# Patient Record
Sex: Female | Born: 1990 | Race: Asian | Hispanic: No | Marital: Single | Smoking: Never smoker
Health system: Southern US, Community
[De-identification: ages and names within clinical notes are randomized; demographics above are authoritative.]

## PROBLEM LIST (undated history)

## (undated) DIAGNOSIS — D649 Anemia, unspecified: Secondary | ICD-10-CM

## (undated) HISTORY — DX: Anemia, unspecified: D64.9

---

## 2017-06-20 ENCOUNTER — Other Ambulatory Visit: Payer: Self-pay

## 2017-06-20 ENCOUNTER — Encounter: Payer: Self-pay | Admitting: Family Medicine

## 2017-06-20 ENCOUNTER — Ambulatory Visit (INDEPENDENT_AMBULATORY_CARE_PROVIDER_SITE_OTHER): Payer: BLUE CROSS/BLUE SHIELD

## 2017-06-20 ENCOUNTER — Ambulatory Visit: Payer: BLUE CROSS/BLUE SHIELD | Admitting: Family Medicine

## 2017-06-20 VITALS — BP 110/74 | HR 78 | Temp 98.3°F | Ht 65.0 in | Wt 115.4 lb

## 2017-06-20 DIAGNOSIS — Z789 Other specified health status: Secondary | ICD-10-CM

## 2017-06-20 DIAGNOSIS — N6320 Unspecified lump in the left breast, unspecified quadrant: Secondary | ICD-10-CM

## 2017-06-20 DIAGNOSIS — R5383 Other fatigue: Secondary | ICD-10-CM

## 2017-06-20 DIAGNOSIS — R0789 Other chest pain: Secondary | ICD-10-CM | POA: Diagnosis not present

## 2017-06-20 DIAGNOSIS — R0600 Dyspnea, unspecified: Secondary | ICD-10-CM

## 2017-06-20 DIAGNOSIS — F4323 Adjustment disorder with mixed anxiety and depressed mood: Secondary | ICD-10-CM

## 2017-06-20 LAB — POCT CBC
GRANULOCYTE PERCENT: 61.1 % (ref 37–80)
HCT, POC: 39.6 % (ref 37.7–47.9)
Hemoglobin: 13.1 g/dL (ref 12.2–16.2)
Lymph, poc: 3.1 (ref 0.6–3.4)
MCH, POC: 28.1 pg (ref 27–31.2)
MCHC: 33 g/dL (ref 31.8–35.4)
MCV: 85.1 fL (ref 80–97)
MID (CBC): 0.2 (ref 0–0.9)
MPV: 8.4 fL (ref 0–99.8)
POC Granulocyte: 5.1 (ref 2–6.9)
POC LYMPH %: 37.1 % (ref 10–50)
POC MID %: 1.8 %M (ref 0–12)
Platelet Count, POC: 234 10*3/uL (ref 142–424)
RBC: 4.66 M/uL (ref 4.04–5.48)
RDW, POC: 13.4 %
WBC: 8.4 10*3/uL (ref 4.6–10.2)

## 2017-06-20 LAB — GLUCOSE, POCT (MANUAL RESULT ENTRY): POC GLUCOSE: 91 mg/dL (ref 70–99)

## 2017-06-20 NOTE — Progress Notes (Signed)
Subjective:  By signing my name below, I, Moises Blood, attest that this documentation has been prepared under the direction and in the presence of Merri Ray, MD. Electronically Signed: Moises Blood, Bruno. 06/20/2017 , 5:05 PM .  Patient was seen in Room 11 .   Patient ID: Brittney Martin, female    DOB: [birth year], 27 y.o.   MRN: [id number] Chief Complaint  Patient presents with  . Establish Care    PHQ 9 positive = 10 (just moved here from [place])  . Shortness of Breath    going on for 4 weeks off and on   . Chest Pain    weakness to the point body is shivering   . lump in left breast    6 weeks ago    HPI Brittney Martin is a 27 y.o. female She is a new patient to establish care, but presents with multiple acute concerns today. No previous records available. 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   Patient reports having chest pain and shortness of breath that started about 4-5 weeks ago. She doesn't remember exactly how her symptoms started, but noticed more with shortness of breath when she was walking or any physical activity. But now, she's noticed shortness of breath even starting out of the blue when laying down, describing more random occurrence now. She felt a more constant chest pain 3 days ago. She also has associated fatigue and loss of appetite. She denies chest pain or shortness of breath today. She notes having shortness of breath when she was anemic in Nov 2018, but doesn't remember what her levels were. She denies requiring blood transfusions. She took iron supplements, but now stopped and is on Vitamin D. She denies hospitalization. She did fly from [place], [place] recently, but denies calf pain or swelling. LMP May 30th, 2019, which was normal. She denies any unprotected intercourse since.   Patient states she's never had depression or anxiety prior. She has met with a counselor in [place] about 2 years ago due to tough time, deciding which path  to take in education and work. She mentions having relationship troubles with her boyfriend and difficulties about 7 weeks ago, and currently describes relationship as complicated. She informs her symptoms mostly started 2 weeks after complications with her boyfriend. She denies SI/HI, but did think "what is the point of living?" a week ago because she wasn't able to see beyond current spot. She describes like she's on a emotional roller coaster. She has felt a little better this week. She hasn't met with a therapist locally. Her boyfriend lives in [place]. She denies feeling on edge or feeling scared. She wasn't able to sleep well last night.   She also mentions having a lump over her left breast noticed about 6 weeks ago. She denies any change in size recently. She denies any lumps or bumps over her right breast. She denies any known history of breast cancer or tumors.    Positive depression screening Depression screen [facility] 2/9 06/20/2017  Decreased Interest 1  Down, Depressed, Hopeless 1  PHQ - 2 Score 2  Altered sleeping 1  Tired, decreased energy 2  Change in appetite 2  Feeling bad or failure about yourself  1  Trouble concentrating 1  Moving slowly or fidgety/restless 1  Suicidal thoughts 1  PHQ-9 Score 11  Difficult doing work/chores Very difficult    There are no active problems to display for this patient.  Past  Medical History:  Diagnosis Date  . Anemia     No Known Allergies Prior to Admission medications   Medication Sig Start Date End Date Taking? Authorizing Provider  Cholecalciferol (VITAMIN D PO) Take by mouth 2 (two) times daily.   Yes [provider]  UNABLE TO [place]  For energy. BID   Yes [provider]  UNABLE TO FIND Triphala Goggul  For digestion and take BID   Yes [provider]   Social History   Socioeconomic History  . Marital status: Single    Spouse name: Not on file  . Number of children: Not on file  .  Years of education: Not on file  . Highest education level: Not on file  Occupational History  . Not on file  Social Needs  . Financial resource strain: Not on file  . Food insecurity:    Worry: Not on file    Inability: Not on file  . Transportation needs:    Medical: Not on file    Non-medical: Not on file  Tobacco Use  . Smoking status: Never Smoker  . Smokeless tobacco: Never Used  Substance and Sexual Activity  . Alcohol use: Never    Frequency: Never  . Drug use: Never  . Sexual activity: Yes  Lifestyle  . Physical activity:    Days per week: Not on file    Minutes per session: Not on file  . Stress: Not on file  Relationships  . Social connections:    Talks on phone: Not on file    Gets together: Not on file    Attends religious service: Not on file    Active member of club or organization: Not on file    Attends meetings of clubs or organizations: Not on file    Relationship status: Not on file  . Intimate partner violence:    Fear of current or ex partner: Not on file    Emotionally abused: Not on file    Physically abused: Not on file    Forced sexual activity: Not on file  Other Topics Concern  . Not on file  Social History Narrative  . Not on file   Review of Systems  Constitutional: Negative for chills, fatigue, fever and unexpected weight change.  Respiratory: Positive for shortness of breath. Negative for cough.   Cardiovascular: Positive for chest pain.  Gastrointestinal: Negative for constipation, diarrhea, nausea and vomiting.  Skin: Negative for rash and wound.       Lump in left breast  Neurological: Positive for weakness. Negative for dizziness and headaches.       Objective:   Physical Exam  Constitutional: She is oriented to person, place, and time. She appears well-developed and well-nourished. No distress.  HENT:  Head: Normocephalic and atraumatic.  Eyes: Pupils are equal, round, and reactive to light. EOM are normal.  Neck: Neck  supple.  Cardiovascular: Normal rate.  Pulmonary/Chest: Effort normal. No respiratory distress. Left breast exhibits mass. Left breast exhibits no nipple discharge and no tenderness.  Breast, left: 2 o'clock, nodular area about 2 cm across, no nipple discharge, no skin retraction, no tenderness  Musculoskeletal: Normal range of motion.  Lymphadenopathy:    She has no cervical adenopathy.    She has no axillary adenopathy.  Neurological: She is alert and oriented to person, place, and time.  Skin: Skin is warm and dry.  Psychiatric: She has a normal mood and affect. Her behavior is normal.  Nursing  note and vitals reviewed.   Vitals:   06/20/17 1557  BP: 110/74  Pulse: 78  Temp: 98.3 F (36.8 C)  TempSrc: Oral  SpO2: 98%  Weight: 115 lb 6.4 oz (52.3 kg)  Height: 5' 5"  (1.651 m)   EKG: sinus rhythm, no acute ischemic findings; no previous EKG for comparison.   Dg Chest 2 View  Result Date: 06/20/2017 CLINICAL DATA:  Chest pain and shortness of breath. EXAM: CHEST - 2 VIEW COMPARISON:  None. FINDINGS: The heart size and mediastinal contours are within normal limits. Both lungs are clear. The visualized skeletal structures are unremarkable. IMPRESSION: No active cardiopulmonary disease. Electronically Signed   By: Staci Righter M.D.   On: 06/20/2017 17:21   Results for orders placed or performed in visit on 06/20/17  POCT glucose (manual entry)  Result Value Ref Range   POC Glucose 91 70 - 99 mg/dl  POCT CBC  Result Value Ref Range   WBC 8.4 4.6 - 10.2 K/uL   Lymph, poc 3.1 0.6 - 3.4   POC LYMPH PERCENT 37.1 10 - 50 %L   MID (cbc) 0.2 0 - 0.9   POC MID % 1.8 0 - 12 %M   POC Granulocyte 5.1 2 - 6.9   Granulocyte percent 61.1 37 - 80 %G   RBC 4.66 4.04 - 5.48 M/uL   Hemoglobin 13.1 12.2 - 16.2 g/dL   HCT, POC 39.6 37.7 - 47.9 %   MCV 85.1 80 - 97 fL   MCH, POC 28.1 27 - 31.2 pg   MCHC 33.0 31.8 - 35.4 g/dL   RDW, POC 13.4 %   Platelet Count, POC 234 142 - 424 K/uL   MPV  8.4 0 - 99.8 fL        Assessment & Plan:    GARLENE APPERSON is a 27 y.o. female Other chest pain - Plan: EKG 12-Lead, DG Chest 2 View, POCT glucose (manual entry), POCT CBC, Sedimentation Rate, D-dimer, quantitative (not at [facility])  Dyspnea, unspecified type - Plan: DG Chest 2 View, POCT glucose (manual entry), POCT CBC, Sedimentation Rate, D-dimer, quantitative (not at [facility])  History of recent air travel - Plan: D-dimer, quantitative (not at [facility])  Left breast mass - Plan: MM Digital Diagnostic Unilat L, US BREAST LTD UNI LEFT INC AXILLA, CANCELED: US BREAST COMPLETE UNI LEFT INC AXILLA  Fatigue, unspecified type  Adjustment disorder with mixed anxiety and depressed mood  Suspected adjustment disorder with anxiety.  Chest pain is nonspecific along with dyspnea.  Reassuring chest x-ray and EKG, reassuring CBC and vital signs.  D-dimer and sed rate were both obtained which were also reassuring.  - phone numbers for therapist provided, handout given on adjustment disorder   -Recheck 1 week by ER/9 1 precautions were given if worsening symptoms.  Left breast mass/abnormality.  Will check with ultrasound and mammogram to decide on further work-up.   No orders of the defined types were placed in this encounter.  Patient Instructions   EKG and chest x-ray look okay today.  Your blood count was also normal as well as blood sugar.  I will check a blood clot test, and other tests that are sent off to the lab, but do not think you have a lung or heart cause for symptoms at this time.  I would like to recheck with you in 1 week, Return to the clinic or go to the nearest emergency room if any of your symptoms worsen or new symptoms  occur.   I would recommend meeting with a therapist as I suspect symptoms could be related to adjustment disorder.  See more information on that condition below.  As we discussed if you do have any thoughts of hurting or harming yourself, call 911 or go straight to the  emergency room. Vivia Budge: [phone] Arvil Chaco: [phone]  I have also ordered an ultrasound and mammogram to evaluate the area of the left breast further.  Once we have those results can discuss further treatment or testing if needed.   Shortness of Breath, Adult Shortness of breath means you have trouble breathing. Your lungs are organs for breathing. Follow these instructions at home: Pay attention to any changes in your symptoms. Take these actions to help with your condition:  Do not smoke. Smoking can cause shortness of breath. If you need help to quit smoking, ask your doctor.  Avoid things that can make it harder to breathe, such as: ? Mold. ? Dust. ? Air pollution. ? Chemical smells. ? Things that can cause allergy symptoms (allergens), if you have allergies.  Keep your living space clean and free of mold and dust.  Rest as needed. Slowly return to your usual activities.  Take over-the-counter and prescription medicines, including oxygen and inhaled medicines, only as told by your doctor.  Keep all follow-up visits as told by your doctor. This is important.  Contact a doctor if:  Your condition does not get better as soon as expected.  You have a hard time doing your normal activities, even after you rest.  You have new symptoms. Get help right away if:  You have trouble breathing when you are resting.  You feel light-headed or you faint.  You have a cough that is not helped by medicines.  You cough up blood.  You have pain with breathing.  You have pain in your chest, arms, shoulders, or belly (abdomen).  You have a fever.  You cannot walk up stairs.  You cannot exercise the way you normally do. This information is not intended to replace advice given to you by your health care provider. Make sure you discuss any questions you have with your health care provider. Document Released: 06/09/2007 Document Revised: 01/08/2016 Document Reviewed:  01/08/2016 Elsevier Interactive Patient Education  2017 Elsevier Inc.  Nonspecific Chest Pain Chest pain can be caused by many different conditions. There is always a chance that your pain could be related to something serious, such as a heart attack or a blood clot in your lungs. Chest pain can also be caused by conditions that are not life-threatening. If you have chest pain, it is very important to follow up with your health care provider. What are the causes? Causes of this condition include:  Heartburn.  Pneumonia or bronchitis.  Anxiety or stress.  Inflammation around your heart (pericarditis) or lung (pleuritis or pleurisy).  A blood clot in your lung.  A collapsed lung (pneumothorax). This can develop suddenly on its own (spontaneous pneumothorax) or from trauma to the chest.  Shingles infection (varicella-zoster virus).  Heart attack.  Damage to the bones, muscles, and cartilage that make up your chest wall. This can include: ? Bruised bones due to injury. ? Strained muscles or cartilage due to frequent or repeated coughing or overwork. ? Fracture to one or more ribs. ? Sore cartilage due to inflammation (costochondritis).  What increases the risk? Risk factors for this condition may include:  Activities that increase your risk for trauma or injury  to your chest.  Respiratory infections or conditions that cause frequent coughing.  Medical conditions or overeating that can cause heartburn.  Heart disease or family history of heart disease.  Conditions or health behaviors that increase your risk of developing a blood clot.  Having had chicken pox (varicella zoster).  What are the signs or symptoms? Chest pain can feel like:  Burning or tingling on the surface of your chest or deep in your chest.  Crushing, pressure, aching, or squeezing pain.  Dull or sharp pain that is worse when you move, cough, or take a deep breath.  Pain that is also felt in your  back, neck, shoulder, or arm, or pain that spreads to any of these areas.  Your chest pain may come and go, or it may stay constant. How is this diagnosed? Lab tests or other studies may be needed to find the cause of your pain. Your health care provider may have you take a test called an ECG (electrocardiogram). An ECG records your heartbeat patterns at the time the test is performed. You may also have other tests, such as:  Transthoracic echocardiogram (TTE). In this test, sound waves are used to create a picture of the heart structures and to look at how blood flows through your heart.  Transesophageal echocardiogram (TEE).This is a more advanced imaging test that takes images from inside your body. It allows your health care provider to see your heart in finer detail.  Cardiac monitoring. This allows your health care provider to monitor your heart rate and rhythm in real time.  Holter monitor. This is a portable device that records your heartbeat and can help to diagnose abnormal heartbeats. It allows your health care provider to track your heart activity for several days, if needed.  Stress tests. These can be done through exercise or by taking medicine that makes your heart beat more quickly.  Blood tests.  Other imaging tests.  How is this treated? Treatment depends on what is causing your chest pain. Treatment may include:  Medicines. These may include: ? Acid blockers for heartburn. ? Anti-inflammatory medicine. ? Pain medicine for inflammatory conditions. ? Antibiotic medicine, if an infection is present. ? Medicines to dissolve blood clots. ? Medicines to treat coronary artery disease (CAD).  Supportive care for conditions that do not require medicines. This may include: ? Resting. ? Applying heat or cold packs to injured areas. ? Limiting activities until pain decreases.  Follow these instructions at home: Medicines  If you were prescribed an antibiotic, take it as  told by your health care provider. Do not stop taking the antibiotic even if you start to feel better.  Take over-the-counter and prescription medicines only as told by your health care provider. Lifestyle  Do not use any products that contain nicotine or tobacco, such as cigarettes and e-cigarettes. If you need help quitting, ask your health care provider.  Do not drink alcohol.  Make lifestyle changes as directed by your health care provider. These may include: ? Getting regular exercise. Ask your health care provider to suggest some activities that are safe for you. ? Eating a heart-healthy diet. A registered dietitian can help you to learn healthy eating options. ? Maintaining a healthy weight. ? Managing diabetes, if necessary. ? Reducing stress, such as with yoga or relaxation techniques. General instructions  Avoid any activities that bring on chest pain.  If heartburn is the cause for your chest pain, raise (elevate) the head of your bed about  6 inches (15 cm) by putting blocks under the legs. Sleeping with more pillows does not effectively relieve heartburn because it only changes the position of your head.  Keep all follow-up visits as told by your health care provider. This is important. This includes any further testing if your chest pain does not go away. Contact a health care provider if:  Your chest pain does not go away.  You have a rash with blisters on your chest.  You have a fever.  You have chills. Get help right away if:  Your chest pain is worse.  You have a cough that gets worse, or you cough up blood.  You have severe pain in your abdomen.  You have severe weakness.  You faint.  You have sudden, unexplained chest discomfort.  You have sudden, unexplained discomfort in your arms, back, neck, or jaw.  You have shortness of breath at any time.  You suddenly start to sweat, or your skin gets clammy.  You feel nauseous or you vomit.  You  suddenly feel light-headed or dizzy.  Your heart begins to beat quickly, or it feels like it is skipping beats. These symptoms may represent a serious problem that is an emergency. Do not wait to see if the symptoms will go away. Get medical help right away. Call your local emergency services (911 in the [place] Do not drive yourself to the hospital. This information is not intended to replace advice given to you by your health care provider. Make sure you discuss any questions you have with your health care provider. Document Released: 09/30/2004 Document Revised: 09/15/2015 Document Reviewed: 09/15/2015 Elsevier Interactive Patient Education  2017 Elsevier Inc.     Adjustment Disorder, Adult Adjustment disorder is a group of symptoms that can develop after a stressful life event, such as the loss of a job or serious physical illness. The symptoms can affect how you feel, think, and act. They may interfere with your relationships. Adjustment disorder increases your risk of suicide and substance abuse. If this disorder is not managed early, it can develop into a more serious condition, such as major depressive disorder or post-traumatic stress disorder. What are the causes? This condition happens when you have trouble recovering from or coping with a stressful life event. What increases the risk? You are more likely to develop this condition if:  You have had depression or anxiety.  You are being treated for a long-term (chronic) illness.  You are being treated for an illness that cannot be cured (terminal illness).  You have a family history of mental illness.  What are the signs or symptoms? Symptoms of this condition include:  Extreme trouble doing daily tasks, such as going to work.  Sadness, depression, or crying spells.  Worrying a lot.  Loss of enjoyment.  Change in appetite or weight.  Feelings of loss or hopelessness.  Thoughts of suicide.  Anxiety, worry, or  nervousness.  Trouble sleeping.  Avoiding family and friends.  Fighting or vandalism.  Complaining of feeling sick without being ill.  Feeling dazed or disconnected.  Nightmares.  Trouble sleeping.  Irritability.  Reckless driving.  Poor work Systems analyst.  Ignoring bills.  Symptoms of this condition start within three months of the stressful event. They do not last more than six months, unless the stressful circumstances last longer. Normal grieving after the death of a loved one is not a symptom of this condition. How is this diagnosed? To diagnose this condition, your health care provider  will ask about what has happened in your life and how it has affected you. He or she may also ask about your medical history and your use of medicines, alcohol, and other substances. Your health care provider may do a physical exam and order lab tests or other studies. You may be referred to a mental health specialist. How is this treated? Treatment options for this condition include:  Counseling or talk therapy. Talk therapy is usually provided by mental health specialists.  Medicines. Certain medicines may help with depression, anxiety, and sleep.  Support groups. These offer emotional support, advice, and guidance. They are made up of people who have had similar experiences.  Observation and time. This is sometimes called "watchful waiting." In this treatment, health care providers monitor your health and behavior without other treatment. Adjustment disorder sometimes gets better on its own with time.  Follow these instructions at home:  Take over-the-counter and prescription medicines only as told by your health care provider.  Keep all follow-up visits as told by your health care provider. This is important. Contact a health care provider if:  Your symptoms do not improve in six months.  Your symptoms get worse. Get help right away if:  You have serious thoughts about hurting  yourself or someone else. If you ever feel like you may hurt yourself or others, or have thoughts about taking your own life, get help right away. You can go to your nearest emergency department or call:  Your local emergency services (911 in the [place]  A suicide crisis helpline, such as the [facility] at 705-251-6762. This is open 24 hours a day.  Summary  Adjustment disorder is a group of symptoms that can develop after a stressful life event, such as the loss of a job or serious physical illness. The symptoms can affect how you feel, think, and act. They may interfere with your relationships.  Symptoms of this condition start within three months of the stressful event. They do not last more than six months, unless the stressful circumstances last longer.  Treatment may include talk therapy, medicines, participation in a support group, or observation to see if symptoms improve.  Contact your health care provider if your symptoms get worse or do not improve in six months.  If you ever feel like you may hurt yourself or others, or have thoughts about taking your own life, get help right away. This information is not intended to replace advice given to you by your health care provider. Make sure you discuss any questions you have with your health care provider. Document Released: 08/25/2005 Document Revised: 02/20/2016 Document Reviewed: 02/20/2016 Elsevier Interactive Patient Education  2018 [employer].     IF you received an x-ray today, you will receive an invoice from [facility]. Please contact [facility] at [phone] with questions or concerns regarding your invoice.   IF you received labwork today, you will receive an invoice from [facility]. Please contact [facility] at [phone] with questions or concerns regarding your invoice.   Our billing staff will not be able to assist you with questions regarding bills from these  companies.  You will be contacted with the lab results as soon as they are available. The fastest way to get your results is to activate your My Chart account. Instructions are located on the last page of this paperwork. If you have not heard from [place] regarding the results in 2 weeks, please contact this office.  I personally performed the services described in this documentation, which was scribed in my presence. The recorded information has been reviewed and considered for accuracy and completeness, addended by me as needed, and agree with information above.  Signed,   Merri Ray, MD [facility].  06/22/17 4:09 PM

## 2017-06-20 NOTE — Patient Instructions (Signed)
EKG and chest x-ray look okay today.  Your blood count was also normal as well as blood sugar.  I will check a blood clot test, and other tests that are sent off to the lab, but do not think you have a lung or heart cause for symptoms at this time.  I would like to recheck with you in 1 week, Return to the clinic or go to the nearest emergency room if any of your symptoms worsen or new symptoms occur.   I would recommend meeting with a therapist as I suspect symptoms could be related to adjustment disorder.  See more information on that condition below.  As we discussed if you do have any thoughts of hurting or harming yourself, call 911 or go straight to the emergency room. Brittney Martin: [phone] Brittney Martin:: [phone]  I have also ordered an ultrasound and mammogram to evaluate the area of the left breast further.  Once we have those results can discuss further treatment or testing if needed.   Shortness of Breath, Adult Shortness of breath means you have trouble breathing. Your lungs are organs for breathing. Follow these instructions at home: Pay attention to any changes in your symptoms. Take these actions to help with your condition:  Do not smoke. Smoking can cause shortness of breath. If you need help to quit smoking, ask your doctor.  Avoid things that can make it harder to breathe, such as: ? Mold. ? Dust. ? Air pollution. ? Chemical smells. ? Things that can cause allergy symptoms (allergens), if you have allergies.  Keep your living space clean and free of mold and dust.  Rest as needed. Slowly return to your usual activities.  Take over-the-counter and prescription medicines, including oxygen and inhaled medicines, only as told by your doctor.  Keep all follow-up visits as told by your doctor. This is important.  Contact a doctor if:  Your condition does not get better as soon as expected.  You have a hard time doing your normal activities, even after you  rest.  You have new symptoms. Get help right away if:  You have trouble breathing when you are resting.  You feel light-headed or you faint.  You have a cough that is not helped by medicines.  You cough up blood.  You have pain with breathing.  You have pain in your chest, arms, shoulders, or belly (abdomen).  You have a fever.  You cannot walk up stairs.  You cannot exercise the way you normally do. This information is not intended to replace advice given to you by your health care provider. Make sure you discuss any questions you have with your health care provider. Document Released: 06/09/2007 Document Revised: 01/08/2016 Document Reviewed: 01/08/2016 Elsevier Interactive Patient Education  2017 Elsevier Inc.  Nonspecific Chest Pain Chest pain can be caused by many different conditions. There is always a chance that your pain could be related to something serious, such as a heart attack or a blood clot in your lungs. Chest pain can also be caused by conditions that are not life-threatening. If you have chest pain, it is very important to follow up with your health care provider. What are the causes? Causes of this condition include:  Heartburn.  Pneumonia or bronchitis.  Anxiety or stress.  Inflammation around your heart (pericarditis) or lung (pleuritis or pleurisy).  A blood clot in your lung.  A collapsed lung (pneumothorax). This can develop suddenly on its own (spontaneous pneumothorax) or from trauma  to the chest.  Shingles infection (varicella-zoster virus).  Heart attack.  Damage to the bones, muscles, and cartilage that make up your chest wall. This can include: ? Bruised bones due to injury. ? Strained muscles or cartilage due to frequent or repeated coughing or overwork. ? Fracture to one or more ribs. ? Sore cartilage due to inflammation (costochondritis).  What increases the risk? Risk factors for this condition may include:  Activities that  increase your risk for trauma or injury to your chest.  Respiratory infections or conditions that cause frequent coughing.  Medical conditions or overeating that can cause heartburn.  Heart disease or family history of heart disease.  Conditions or health behaviors that increase your risk of developing a blood clot.  Having had chicken pox (varicella zoster).  What are the signs or symptoms? Chest pain can feel like:  Burning or tingling on the surface of your chest or deep in your chest.  Crushing, pressure, aching, or squeezing pain.  Dull or sharp pain that is worse when you move, cough, or take a deep breath.  Pain that is also felt in your back, neck, shoulder, or arm, or pain that spreads to any of these areas.  Your chest pain may come and go, or it may stay constant. How is this diagnosed? Lab tests or other studies may be needed to find the cause of your pain. Your health care provider may have you take a test called an ECG (electrocardiogram). An ECG records your heartbeat patterns at the time the test is performed. You may also have other tests, such as:  Transthoracic echocardiogram (TTE). In this test, sound waves are used to create a picture of the heart structures and to look at how blood flows through your heart.  Transesophageal echocardiogram (TEE).This is a more advanced imaging test that takes images from inside your body. It allows your health care provider to see your heart in finer detail.  Cardiac monitoring. This allows your health care provider to monitor your heart rate and rhythm in real time.  Holter monitor. This is a portable device that records your heartbeat and can help to diagnose abnormal heartbeats. It allows your health care provider to track your heart activity for several days, if needed.  Stress tests. These can be done through exercise or by taking medicine that makes your heart beat more quickly.  Blood tests.  Other imaging  tests.  How is this treated? Treatment depends on what is causing your chest pain. Treatment may include:  Medicines. These may include: ? Acid blockers for heartburn. ? Anti-inflammatory medicine. ? Pain medicine for inflammatory conditions. ? Antibiotic medicine, if an infection is present. ? Medicines to dissolve blood clots. ? Medicines to treat coronary artery disease (CAD).  Supportive care for conditions that do not require medicines. This may include: ? Resting. ? Applying heat or cold packs to injured areas. ? Limiting activities until pain decreases.  Follow these instructions at home: Medicines  If you were prescribed an antibiotic, take it as told by your health care provider. Do not stop taking the antibiotic even if you start to feel better.  Take over-the-counter and prescription medicines only as told by your health care provider. Lifestyle  Do not use any products that contain nicotine or tobacco, such as cigarettes and e-cigarettes. If you need help quitting, ask your health care provider.  Do not drink alcohol.  Make lifestyle changes as directed by your health care provider. These may include: ?  Getting regular exercise. Ask your health care provider to suggest some activities that are safe for you. ? Eating a heart-healthy diet. A registered dietitian can help you to learn healthy eating options. ? Maintaining a healthy weight. ? Managing diabetes, if necessary. ? Reducing stress, such as with yoga or relaxation techniques. General instructions  Avoid any activities that bring on chest pain.  If heartburn is the cause for your chest pain, raise (elevate) the head of your bed about 6 inches (15 cm) by putting blocks under the legs. Sleeping with more pillows does not effectively relieve heartburn because it only changes the position of your head.  Keep all follow-up visits as told by your health care provider. This is important. This includes any further  testing if your chest pain does not go away. Contact a health care provider if:  Your chest pain does not go away.  You have a rash with blisters on your chest.  You have a fever.  You have chills. Get help right away if:  Your chest pain is worse.  You have a cough that gets worse, or you cough up blood.  You have severe pain in your abdomen.  You have severe weakness.  You faint.  You have sudden, unexplained chest discomfort.  You have sudden, unexplained discomfort in your arms, back, neck, or jaw.  You have shortness of breath at any time.  You suddenly start to sweat, or your skin gets clammy.  You feel nauseous or you vomit.  You suddenly feel light-headed or dizzy.  Your heart begins to beat quickly, or it feels like it is skipping beats. These symptoms may represent a serious problem that is an emergency. Do not wait to see if the symptoms will go away. Get medical help right away. Call your local emergency services (911 in the [place] Do not drive yourself to the hospital. This information is not intended to replace advice given to you by your health care provider. Make sure you discuss any questions you have with your health care provider. Document Released: 09/30/2004 Document Revised: 09/15/2015 Document Reviewed: 09/15/2015 Elsevier Interactive Patient Education  2017 Elsevier Inc.     Adjustment Disorder, Adult Adjustment disorder is a group of symptoms that can develop after a stressful life event, such as the loss of a job or serious physical illness. The symptoms can affect how you feel, think, and act. They may interfere with your relationships. Adjustment disorder increases your risk of suicide and substance abuse. If this disorder is not managed early, it can develop into a more serious condition, such as major depressive disorder or post-traumatic stress disorder. What are the causes? This condition happens when you have trouble recovering from or  coping with a stressful life event. What increases the risk? You are more likely to develop this condition if:  You have had depression or anxiety.  You are being treated for a long-term (chronic) illness.  You are being treated for an illness that cannot be cured (terminal illness).  You have a family history of mental illness.  What are the signs or symptoms? Symptoms of this condition include:  Extreme trouble doing daily tasks, such as going to work.  Sadness, depression, or crying spells.  Worrying a lot.  Loss of enjoyment.  Change in appetite or weight.  Feelings of loss or hopelessness.  Thoughts of suicide.  Anxiety, worry, or nervousness.  Trouble sleeping.  Avoiding family and friends.  Fighting or vandalism.  Complaining of  feeling sick without being ill.  Feeling dazed or disconnected.  Nightmares.  Trouble sleeping.  Irritability.  Reckless driving.  Poor work International aid/development worker.  Ignoring bills.  Symptoms of this condition start within three months of the stressful event. They do not last more than six months, unless the stressful circumstances last longer. Normal grieving after the death of a loved one is not a symptom of this condition. How is this diagnosed? To diagnose this condition, your health care provider will ask about what has happened in your life and how it has affected you. He or she may also ask about your medical history and your use of medicines, alcohol, and other substances. Your health care provider may do a physical exam and order lab tests or other studies. You may be referred to a mental health specialist. How is this treated? Treatment options for this condition include:  Counseling or talk therapy. Talk therapy is usually provided by mental health specialists.  Medicines. Certain medicines may help with depression, anxiety, and sleep.  Support groups. These offer emotional support, advice, and guidance. They are made up  of people who have had similar experiences.  Observation and time. This is sometimes called "watchful waiting." In this treatment, health care providers monitor your health and behavior without other treatment. Adjustment disorder sometimes gets better on its own with time.  Follow these instructions at home:  Take over-the-counter and prescription medicines only as told by your health care provider.  Keep all follow-up visits as told by your health care provider. This is important. Contact a health care provider if:  Your symptoms do not improve in six months.  Your symptoms get worse. Get help right away if:  You have serious thoughts about hurting yourself or someone else. If you ever feel like you may hurt yourself or others, or have thoughts about taking your own life, get help right away. You can go to your nearest emergency department or call:  Your local emergency services (911 in the [place]  A suicide crisis helpline, such as the National Suicide Prevention Lifeline at (917)015-1930. This is open 24 hours a day.  Summary  Adjustment disorder is a group of symptoms that can develop after a stressful life event, such as the loss of a job or serious physical illness. The symptoms can affect how you feel, think, and act. They may interfere with your relationships.  Symptoms of this condition start within three months of the stressful event. They do not last more than six months, unless the stressful circumstances last longer.  Treatment may include talk therapy, medicines, participation in a support group, or observation to see if symptoms improve.  Contact your health care provider if your symptoms get worse or do not improve in six months.  If you ever feel like you may hurt yourself or others, or have thoughts about taking your own life, get help right away. This information is not intended to replace advice given to you by your health care provider. Make sure you discuss  any questions you have with your health care provider. Document Released: 08/25/2005 Document Revised: 02/20/2016 Document Reviewed: 02/20/2016 Elsevier Interactive Patient Education  2018 [employer].     IF you received an x-ray today, you will receive an invoice from [facility]. Please contact [facility] at [phone] with questions or concerns regarding your invoice.   IF you received labwork today, you will receive an invoice from [facility]. Please contact [facility] at [phone] with questions or concerns  regarding your invoice.   Our billing staff will not be able to assist you with questions regarding bills from these companies.  You will be contacted with the lab results as soon as they are available. The fastest way to get your results is to activate your My Chart account. Instructions are located on the last page of this paperwork. If you have not heard from [place] regarding the results in 2 weeks, please contact this office.

## 2017-06-21 LAB — SEDIMENTATION RATE: Sed Rate: 14 mm/hr (ref 0–32)

## 2017-06-21 LAB — D-DIMER, QUANTITATIVE (NOT AT ARMC): D-DIMER: 0.23 mg{FEU}/L (ref 0.00–0.49)

## 2017-06-30 ENCOUNTER — Ambulatory Visit
Admission: RE | Admit: 2017-06-30 | Discharge: 2017-06-30 | Disposition: A | Discharge status: Home / Self Care | Payer: BLUE CROSS/BLUE SHIELD | Source: Ambulatory Visit | Attending: Family Medicine | Admitting: Family Medicine

## 2017-06-30 ENCOUNTER — Other Ambulatory Visit: Payer: Self-pay

## 2017-06-30 ENCOUNTER — Ambulatory Visit: Payer: BLUE CROSS/BLUE SHIELD | Admitting: Family Medicine

## 2017-06-30 ENCOUNTER — Encounter: Payer: Self-pay | Admitting: Family Medicine

## 2017-06-30 VITALS — BP 110/60 | HR 76 | Temp 98.1°F | Ht 65.0 in | Wt 115.2 lb

## 2017-06-30 DIAGNOSIS — N6320 Unspecified lump in the left breast, unspecified quadrant: Secondary | ICD-10-CM

## 2017-06-30 DIAGNOSIS — R079 Chest pain, unspecified: Secondary | ICD-10-CM | POA: Diagnosis not present

## 2017-06-30 DIAGNOSIS — R0600 Dyspnea, unspecified: Secondary | ICD-10-CM | POA: Diagnosis not present

## 2017-06-30 DIAGNOSIS — F4329 Adjustment disorder with other symptoms: Secondary | ICD-10-CM | POA: Diagnosis not present

## 2017-06-30 NOTE — Patient Instructions (Addendum)
I am glad to hear that your symptoms are improving.  Continue coping techniques and other stress management techniques as discussed last visit.  Let me know if you would like other numbers for therapists, but otherwise follow-up with me in 4 weeks to see how things are going.  If any worsening symptoms prior to that time, please return sooner   IF you received an x-ray today, you will receive an invoice from [facility]. Please contact [facility] at [phone] with questions or concerns regarding your invoice.   IF you received labwork today, you will receive an invoice from [facility]. Please contact [facility] at [phone] with questions or concerns regarding your invoice.   Our billing staff will not be able to assist you with questions regarding bills from these companies.  You will be contacted with the lab results as soon as they are available. The fastest way to get your results is to activate your My Chart account. Instructions are located on the last page of this paperwork. If you have not heard from us regarding the results in 2 weeks, please contact this office.

## 2017-06-30 NOTE — Progress Notes (Signed)
I,Brittney Martin,acting as a scribe for Brittney FloodJeffrey R Emilie Carp, MD.,have documented all relevant documentation on the behalf of Brittney FloodJeffrey R Samarth Ogle, MD,as directed by  Brittney FloodJeffrey R Zonya Gudger, MD while in the presence of Brittney FloodJeffrey R Jayleen Scaglione, MD. 06/30/2017  5:07 PM  Subjective:    Patient ID: Brittney Martin, female    DOB: September 05, 1990, 27 y.o.   MRN: [id number]  Chief Complaint  Patient presents with  . chest pain and occastional SOB    1 week follow up   PHQ-9 =8 and a bit more engergy     HPI Brittney CoteDiksha D Bogdanski is a 27 y.o. female who [facility]. She was last seen for CP and occasional SOB on 06/20/2017 and a left breast abnormality.   CP and occasional SOB Reassuring CXR and CT Normal CBC and normal Vitals Negative D-dimer Normal sed-rate Thought to have component and adjustment disorder. Handout was given and phones for therapist provided.   -She notes that her breathing is better and CP is better. Less frequent. - She is coping with her stress more. The stressors in her life have not improved, but she is trying to cope and not to get into negative moods.She tries to talk with family, exercise, and listen to music. She is eating more often. She gets about 5-6 hours sleep per night. She has occasional nightmares. Better, but she is still a little stressed and depressed.  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   Left Breast Abnormality Left breast US 06/30/2017 without evidence of malignancy. Benign appearing cyst in the upper outer quadrant of the left breast including at the area of concern around 2 o'clock. No repeat imagining indicated unless worsening of condition.   There are no active problems to display for this patient.  Past Medical History:  Diagnosis Date  . Anemia    No past surgical history on file. No Known Allergies Prior to Admission medications   Medication Sig Start Date End Date Taking? Authorizing  Provider  Cholecalciferol (VITAMIN D PO) Take by mouth 2 (two) times daily.    [provider]  UNABLE TO FIND Lolita RiegerGiloy Ghanwati  For energy. BID    [provider]  UNABLE TO FIND Triphala Goggul  For digestion and take BID    [provider]   Social History   Socioeconomic History  . Marital status: Single    Spouse name: Not on file  . Number of children: Not on file  . Years of education: Not on file  . Highest education level: Not on file  Occupational History  . Not on file  Social Needs  . Financial resource strain: Not on file  . Food insecurity:    Worry: Not on file    Inability: Not on file  . Transportation needs:    Medical: Not on file    Non-medical: Not on file  Tobacco Use  . Smoking status: Never Smoker  . Smokeless tobacco: Never Used  Substance and Sexual Activity  . Alcohol use: Never    Frequency: Never  . Drug use: Never  . Sexual activity: Yes  Lifestyle  . Physical activity:    Days per week: Not on file    Minutes per session: Not on file  . Stress: Not on file  Relationships  . Social connections:    Talks on phone: Not on file    Gets  together: Not on file    Attends religious service: Not on file    Active member of club or organization: Not on file    Attends meetings of clubs or organizations: Not on file    Relationship status: Not on file  . Intimate partner violence:    Fear of current or ex partner: Not on file    Emotionally abused: Not on file    Physically abused: Not on file    Forced sexual activity: Not on file  Other Topics Concern  . Not on file  Social History Narrative  . Not on file     Review of Systems  Constitutional: Negative for fatigue and unexpected weight change.  Respiratory: Negative for chest tightness and shortness of breath.   Cardiovascular: Negative for chest pain, palpitations and leg swelling.  Gastrointestinal: Negative for abdominal pain and blood in stool.    Neurological: Negative for dizziness, syncope, light-headedness and headaches.  All other systems reviewed and are negative.      Objective:   Physical Exam  Constitutional: She is oriented to person, place, and time. She appears well-developed and well-nourished.  HENT:  Head: Normocephalic and atraumatic.  Eyes: Pupils are equal, round, and reactive to light. Conjunctivae and EOM are normal.  Neck: Carotid bruit is not present.  Cardiovascular: Normal rate, regular rhythm, normal heart sounds and intact distal pulses.  Pulmonary/Chest: Effort normal and breath sounds normal.  Abdominal: Soft. She exhibits no pulsatile midline mass. There is no tenderness.  Neurological: She is alert and oriented to person, place, and time.  Skin: Skin is warm and dry.  Psychiatric: She has a normal mood and affect. Her behavior is normal.  Vitals reviewed.    Vitals:   06/30/17 1706  BP: 110/60  Pulse: 76  Temp: 98.1 F (36.7 C)  TempSrc: Oral  SpO2: 99%  Weight: 115 lb 3.2 oz (52.3 kg)  Height: 5\' 5"  (1.651 m)       Assessment & Plan:  Brittney Martin is a 27 y.o. female Chest pain, unspecified type  Dyspnea, unspecified type  Adjustment disorder with other symptom Has had improvement of chest pain and dyspnea.  Previous evaluation was reassuring including lab work.  Suspect significant component of adjustment disorder at that time which she states is improving.  Still discussed counseling, other coping techniques for adjustment disorder were discussed with greater than 50% of visit with counseling.  Also recommended increase sleep.  plan on follow-up within 1 month to determine control of symptoms at that time, but doubt need for SSRI or chronic daily medication at this point.  RTC precautions if worsening.    No orders of the defined types were placed in this encounter.  Patient Instructions   I am glad to hear that your symptoms are improving.  Continue coping techniques and  other stress management techniques as discussed last visit.  Let me know if you would like other numbers for therapists, but otherwise follow-up with me in 4 weeks to see how things are going.  If any worsening symptoms prior to that time, please return sooner   IF you received an x-ray today, you will receive an invoice from [facility]. Please contact [facility] at [phone] with questions or concerns regarding your invoice.   IF you received labwork today, you will receive an invoice from [facility]. Please contact [facility] at [phone] with questions or concerns regarding your invoice.   Our billing staff will not be able to  assist you with questions regarding bills from these companies.  You will be contacted with the lab results as soon as they are available. The fastest way to get your results is to activate your My Chart account. Instructions are located on the last page of this paperwork. If you have not heard from [place] regarding the results in 2 weeks, please contact this office.       I personally performed the services described in this documentation, which was scribed in my presence. The recorded information has been reviewed and considered for accuracy and completeness, addended by me as needed, and agree with information above.  Signed,   Meredith Staggers, MD Primary Care at [facility].  07/01/17 10:12 PM

## 2019-04-07 IMAGING — DX DG CHEST 2V
2 series · 2 of 2 positions shown · non-contrast
Comparison: None.

CLINICAL DATA: Chest pain and shortness of breath.

EXAM:
CHEST - 2 VIEW

[chest pa]
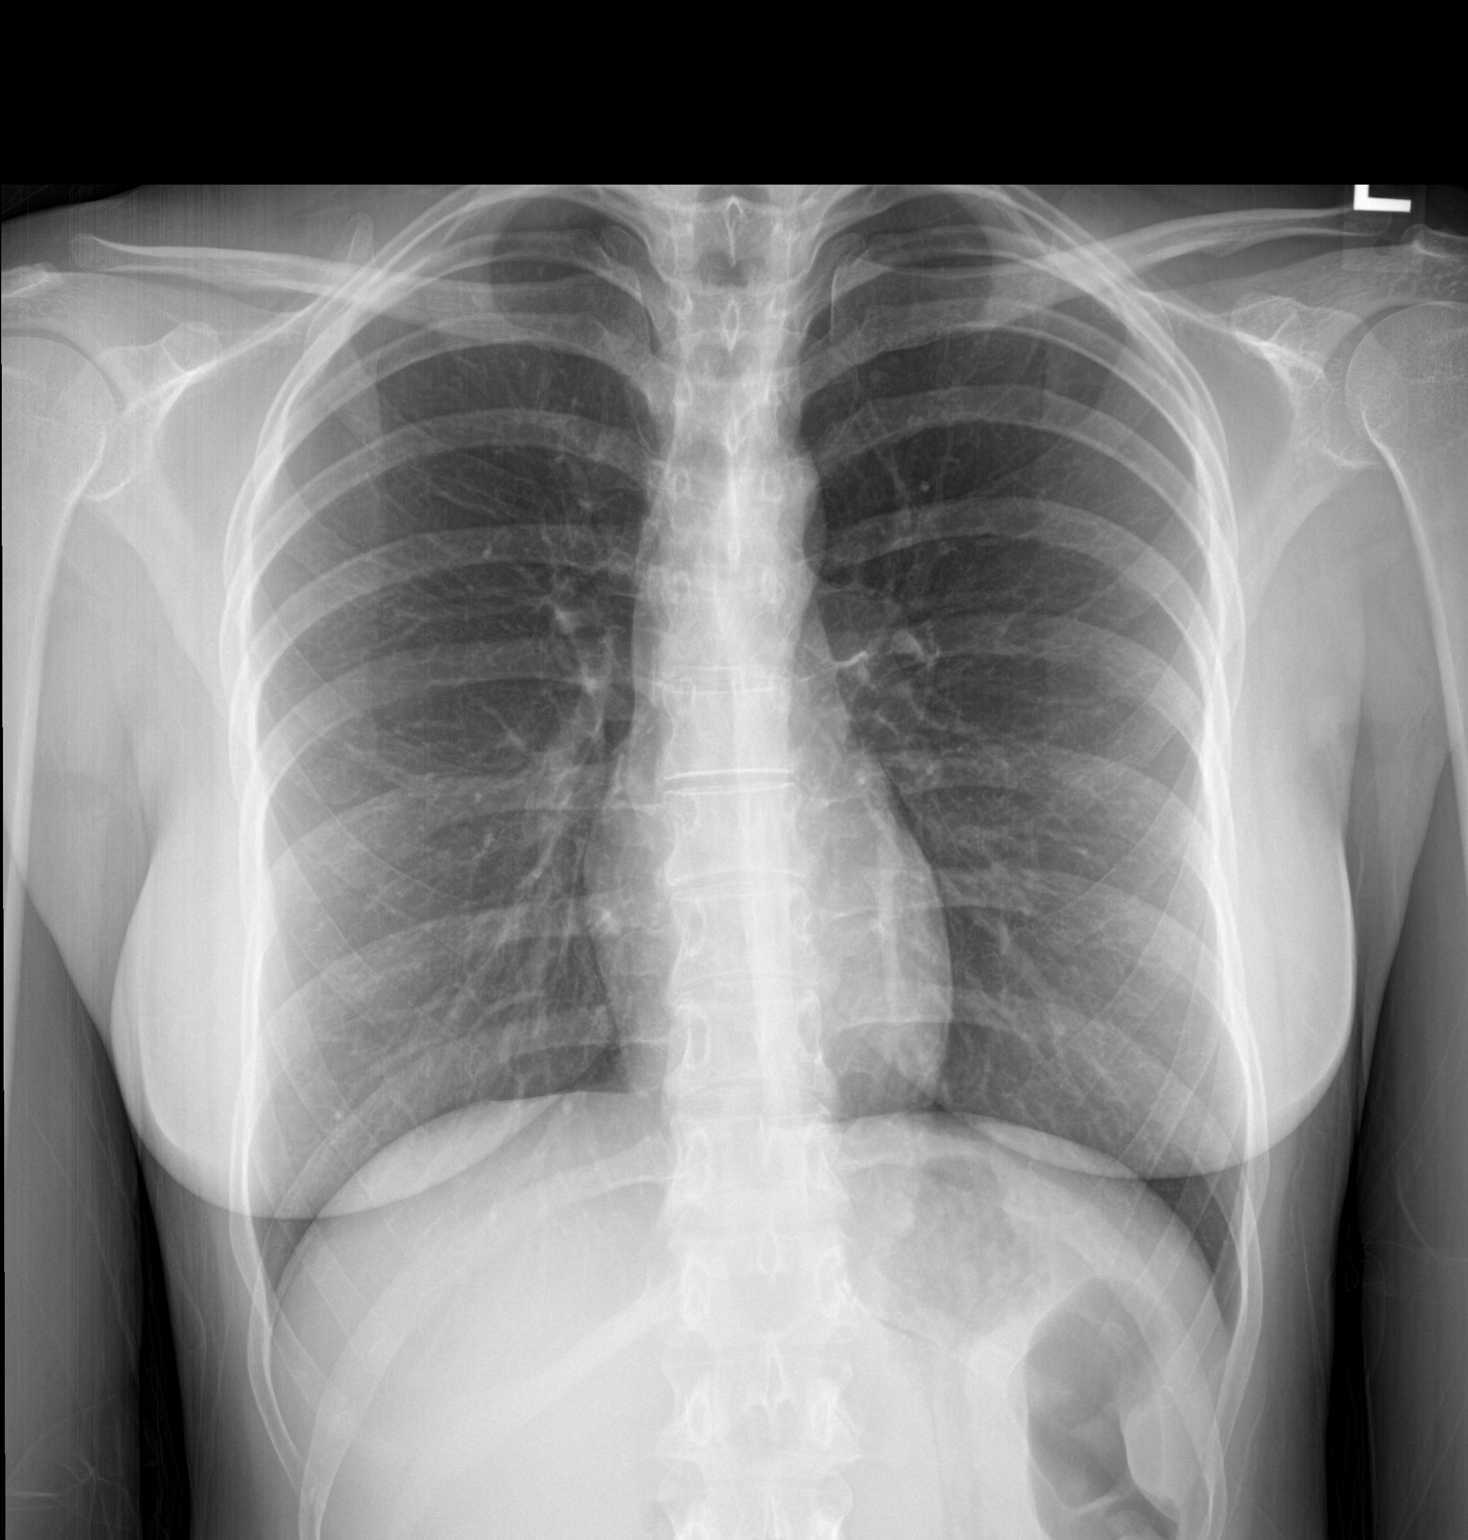

[chest lat]
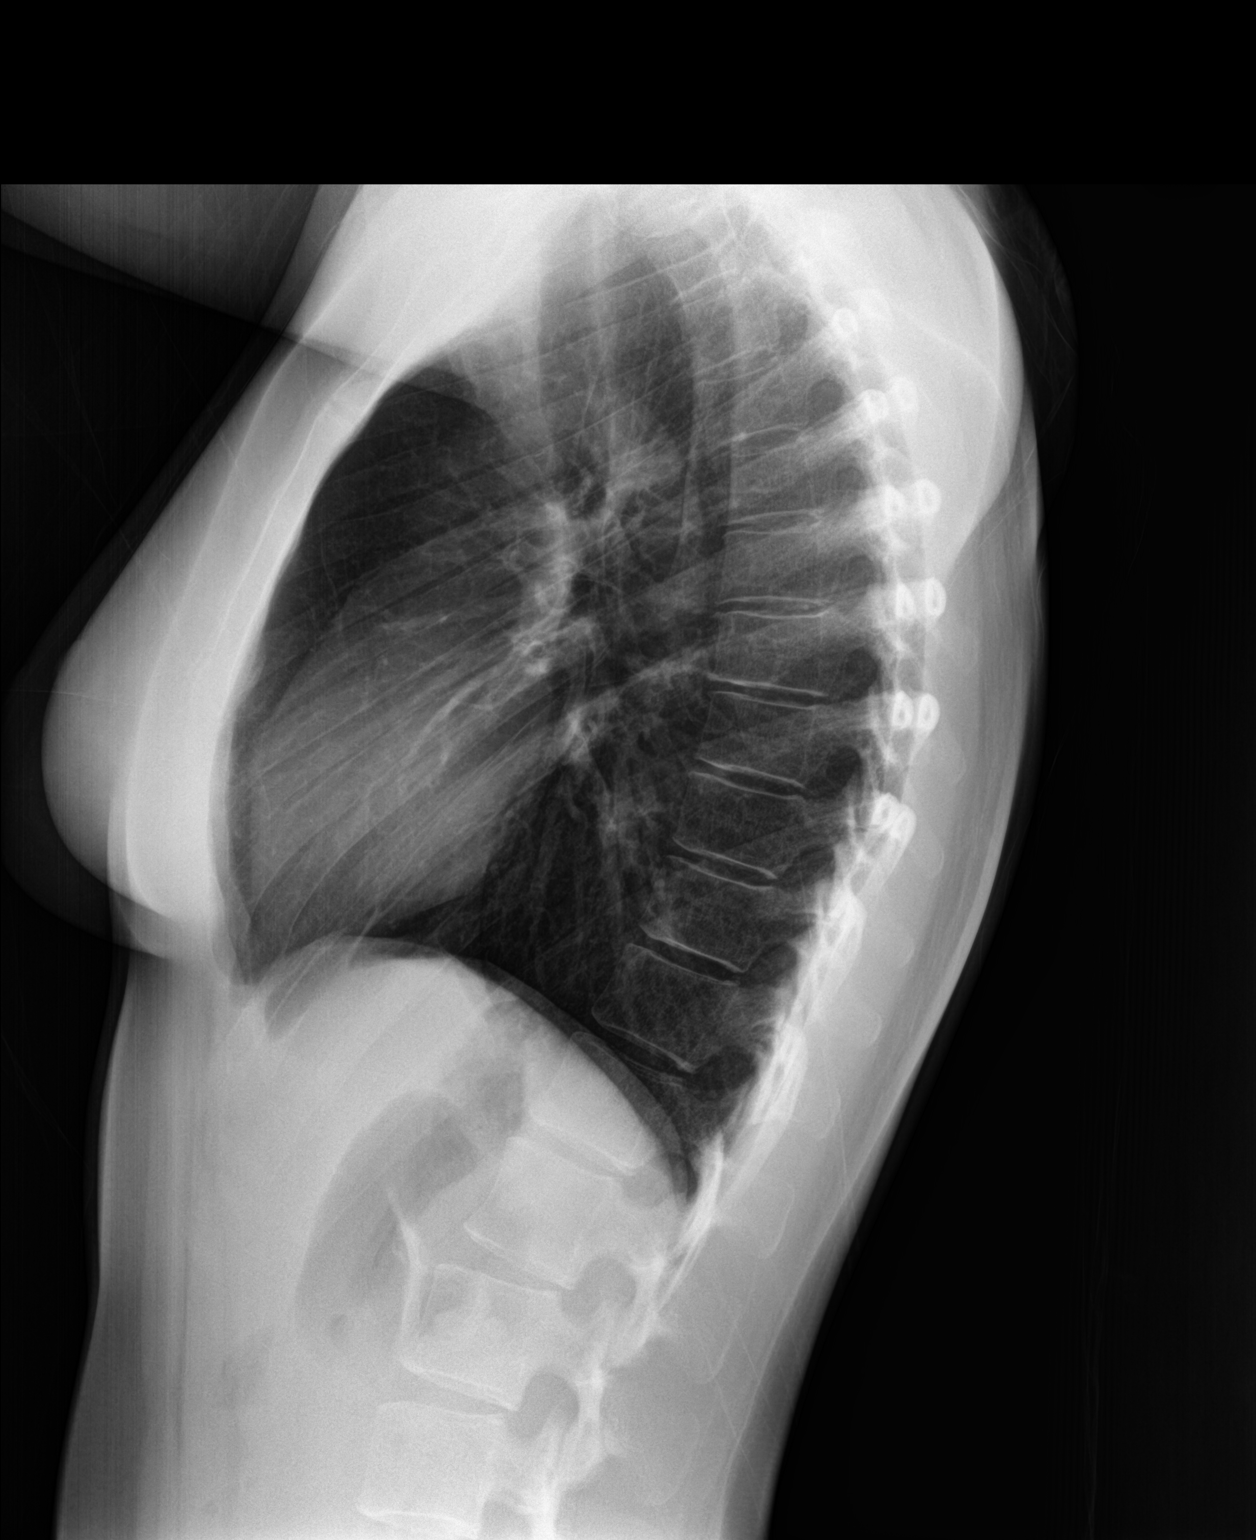

[2 of 2 positions shown; findings below may reference images not displayed]

FINDINGS: The heart size and mediastinal contours are within normal limits.
Both lungs are clear. The visualized skeletal structures are
unremarkable.
IMPRESSION: No active cardiopulmonary disease.
